# Patient Record
Sex: Female | Born: 2000 | Race: White | Hispanic: No | Marital: Single | Smoking: Current every day smoker
Health system: Southern US, Community
[De-identification: ages and names within clinical notes are randomized; demographics above are authoritative.]

## PROBLEM LIST (undated history)

## (undated) DIAGNOSIS — D649 Anemia, unspecified: Secondary | ICD-10-CM

## (undated) DIAGNOSIS — J45909 Unspecified asthma, uncomplicated: Secondary | ICD-10-CM

## (undated) HISTORY — PX: WISDOM TOOTH EXTRACTION: SHX21

## (undated) HISTORY — PX: HERNIA REPAIR: SHX51

---
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
## 2022-01-01 ENCOUNTER — Other Ambulatory Visit: Payer: Self-pay

## 2022-01-01 ENCOUNTER — Ambulatory Visit (INDEPENDENT_AMBULATORY_CARE_PROVIDER_SITE_OTHER): Payer: Medicaid Other | Admitting: Family Medicine

## 2022-01-01 VITALS — BP 107/61 | HR 85

## 2022-01-01 DIAGNOSIS — Z3403 Encounter for supervision of normal first pregnancy, third trimester: Secondary | ICD-10-CM

## 2022-01-01 DIAGNOSIS — O365990 Maternal care for other known or suspected poor fetal growth, unspecified trimester, not applicable or unspecified: Secondary | ICD-10-CM

## 2022-01-01 NOTE — Progress Notes (Signed)
   PRENATAL VISIT NOTE  Subjective:  Savannah Santos is a 21 y.o. G1P0000 at [redacted]w[redacted]d being seen today for ongoing prenatal care.  She is currently monitored for the following issues for this {Blank single:19197::"high-risk","low-risk"} pregnancy and has Supervision of low-risk first pregnancy; ADHD; Anemia; Anxiety; Depression; Dyslexia; Exercise-induced asthma; PTSD (post-traumatic stress disorder); Scoliosis; Rubella non-immune status, antepartum; Marginal insertion of umbilical cord affecting management of mother; and Fetal growth restriction antepartum on their problem list.  Patient reports {sx:14538}.  Contractions: Not present. Vag. Bleeding: None.  Movement: Present. Denies leaking of fluid.   The following portions of the patient's history were reviewed and updated as appropriate: allergies, current medications, past family history, past medical history, past social history, past surgical history and problem list.   Objective:   Vitals:   01/01/22 1401  BP: 107/61  Pulse: 85    Fetal Status: Fetal Heart Rate (bpm): 134   Movement: Present     General:  Alert, oriented and cooperative. Patient is in no acute distress.  Skin: Skin is warm and dry. No rash noted.   Cardiovascular: Normal heart rate noted  Respiratory: Normal respiratory effort, no problems with respiration noted  Abdomen: Soft, gravid, appropriate for gestational age.  Pain/Pressure: Absent     Pelvic: {Blank single:19197::"Cervical exam performed in the presence of a chaperone","Cervical exam deferred"}        Extremities: Normal range of motion.     Mental Status: Normal mood and affect. Normal behavior. Normal judgment and thought content.   Assessment and Plan:  Pregnancy: G1P0000 at [redacted]w[redacted]d 1. Encounter for supervision of low-risk first pregnancy in third trimester ***  2. Fetal growth restriction antepartum ***  {Blank single:19197::"Term","Preterm"} labor symptoms and general obstetric precautions  including but not limited to vaginal bleeding, contractions, leaking of fluid and fetal movement were reviewed in detail with the patient. Please refer to After Visit Summary for other counseling recommendations.   No follow-ups on file.  Future Appointments  Date Time Provider Department Center  01/08/2022  2:45 PM [facility] NURSE [facility]  01/08/2022  3:00 PM [facility] US1 [facility]  01/15/2022  1:35 PM Reva Bores, MD [facility]  01/22/2022  9:15 AM Reva Bores, MD [facility]  01/29/2022  8:35 AM Reva Bores, MD [facility]  02/05/2022  9:35 AM Federico Flake, MD [facility]  02/09/2022  9:15 AM [facility] NST [facility]  02/09/2022 11:15 AM Federico Flake, MD [facility]    Federico Flake, MD

## 2022-01-02 ENCOUNTER — Encounter: Payer: Self-pay | Admitting: Family Medicine

## 2022-01-08 ENCOUNTER — Ambulatory Visit: Payer: Medicaid Other | Attending: Obstetrics

## 2022-01-08 ENCOUNTER — Other Ambulatory Visit: Payer: Self-pay | Admitting: *Deleted

## 2022-01-08 ENCOUNTER — Ambulatory Visit: Payer: Medicaid Other | Admitting: *Deleted

## 2022-01-08 VITALS — BP 130/61 | HR 92

## 2022-01-08 DIAGNOSIS — Z3403 Encounter for supervision of normal first pregnancy, third trimester: Secondary | ICD-10-CM | POA: Diagnosis present

## 2022-01-08 DIAGNOSIS — O365990 Maternal care for other known or suspected poor fetal growth, unspecified trimester, not applicable or unspecified: Secondary | ICD-10-CM

## 2022-01-08 DIAGNOSIS — Z3A35 35 weeks gestation of pregnancy: Secondary | ICD-10-CM | POA: Diagnosis not present

## 2022-01-08 DIAGNOSIS — O365930 Maternal care for other known or suspected poor fetal growth, third trimester, not applicable or unspecified: Secondary | ICD-10-CM | POA: Diagnosis present

## 2022-01-08 DIAGNOSIS — O43193 Other malformation of placenta, third trimester: Secondary | ICD-10-CM | POA: Diagnosis not present

## 2022-01-15 ENCOUNTER — Ambulatory Visit (INDEPENDENT_AMBULATORY_CARE_PROVIDER_SITE_OTHER): Payer: Medicaid Other | Admitting: Family Medicine

## 2022-01-15 ENCOUNTER — Other Ambulatory Visit: Payer: Self-pay

## 2022-01-15 ENCOUNTER — Other Ambulatory Visit (HOSPITAL_COMMUNITY)
Admission: RE | Admit: 2022-01-15 | Discharge: 2022-01-15 | Disposition: A | Discharge status: Home / Self Care | Payer: Medicaid Other | Source: Ambulatory Visit | Attending: Family Medicine | Admitting: Family Medicine

## 2022-01-15 ENCOUNTER — Encounter: Payer: Self-pay | Admitting: Family Medicine

## 2022-01-15 VITALS — BP 114/76 | HR 105 | Wt 155.0 lb

## 2022-01-15 DIAGNOSIS — Z3403 Encounter for supervision of normal first pregnancy, third trimester: Secondary | ICD-10-CM

## 2022-01-15 DIAGNOSIS — Z2839 Other underimmunization status: Secondary | ICD-10-CM

## 2022-01-15 DIAGNOSIS — O09899 Supervision of other high risk pregnancies, unspecified trimester: Secondary | ICD-10-CM

## 2022-01-15 DIAGNOSIS — O365990 Maternal care for other known or suspected poor fetal growth, unspecified trimester, not applicable or unspecified: Secondary | ICD-10-CM

## 2022-01-15 DIAGNOSIS — O43199 Other malformation of placenta, unspecified trimester: Secondary | ICD-10-CM

## 2022-01-15 NOTE — Progress Notes (Signed)
   PRENATAL VISIT NOTE  Subjective:  Savannah Santos is a 21 y.o. G1P0000 at 32w4dbeing seen today for ongoing prenatal care.  She is currently monitored for the following issues for this low-risk pregnancy and has Supervision of low-risk first pregnancy; ADHD; Anemia; Anxiety; Depression; Dyslexia; Exercise-induced asthma; PTSD (post-traumatic stress disorder); Scoliosis; Rubella non-immune status, antepartum; Marginal insertion of umbilical cord affecting management of mother; and Fetal growth restriction antepartum on their problem list.  Patient reports no complaints.  Contractions: Irritability. Vag. Bleeding: None.  Movement: Present. Denies leaking of fluid.   The following portions of the patient's history were reviewed and updated as appropriate: allergies, current medications, past family history, past medical history, past social history, past surgical history and problem list.   Objective:   Vitals:   01/15/22 1341  BP: 114/76  Pulse: (!) 105  Weight: 155 lb (70.3 kg)    Fetal Status: Fetal Heart Rate (bpm): 150 Fundal Height: 35 cm Movement: Present  Presentation: Vertex  General:  Alert, oriented and cooperative. Patient is in no acute distress.  Skin: Skin is warm and dry. No rash noted.   Cardiovascular: Normal heart rate noted  Respiratory: Normal respiratory effort, no problems with respiration noted  Abdomen: Soft, gravid, appropriate for gestational age.  Pain/Pressure: Present     Pelvic: Cervical exam performed in the presence of a chaperone Dilation: 1.5 Effacement (%): 70 Station: -2  Extremities: Normal range of motion.     Mental Status: Normal mood and affect. Normal behavior. Normal judgment and thought content.   Assessment and Plan:  Pregnancy: G1P0000 at 362w4d. Fetal growth restriction antepartum Last at 13%--f/u in 3-4 weeks IOL @ 39-40 weeks if next growth ok  2. Marginal insertion of umbilical cord affecting management of mother Last  growth  3. Rubella non-immune status, antepartum MMR pp  4. Encounter for supervision of low-risk first pregnancy in third trimester Cultures today - GC/Chlamydia probe amp ([place], beta strep (group b only)  Preterm labor symptoms and general obstetric precautions including but not limited to vaginal bleeding, contractions, leaking of fluid and fetal movement were reviewed in detail with the patient. Please refer to After Visit Summary for other counseling recommendations.   Return in 1 week (on 01/22/2022).  Future Appointments  Date Time Provider DeEmery10/08/2021  9:15 AM PrDonnamae JudeMD [facility]10/03/2022  8:35 AM PrDonnamae JudeMD [facility]10/03/2022  3:30 PM [facility] NURSE [facility]10/03/2022  3:45 PM [facility] US5 [facility]10/19/2023  9:35 AM NeCaren MacadamMD WM[facility]10/23/2023  9:15 AM [facility] NST [facility]10/23/2023 11:15 AM NeCaren MacadamMD WMThe [facility]  TaDonnamae JudeMD

## 2022-01-16 LAB — GC/CHLAMYDIA PROBE AMP (~~LOC~~) NOT AT ARMC
Chlamydia: NEGATIVE
Comment: NEGATIVE
Comment: NORMAL
Neisseria Gonorrhea: NEGATIVE

## 2022-01-18 LAB — CULTURE, BETA STREP (GROUP B ONLY): Strep Gp B Culture: POSITIVE — AB

## 2022-01-19 ENCOUNTER — Encounter: Payer: Self-pay | Admitting: Family Medicine

## 2022-01-19 DIAGNOSIS — O99820 Streptococcus B carrier state complicating pregnancy: Secondary | ICD-10-CM | POA: Insufficient documentation

## 2022-01-22 ENCOUNTER — Ambulatory Visit (INDEPENDENT_AMBULATORY_CARE_PROVIDER_SITE_OTHER): Payer: Medicaid Other | Admitting: Family Medicine

## 2022-01-22 ENCOUNTER — Other Ambulatory Visit: Payer: Self-pay

## 2022-01-22 VITALS — BP 127/78 | HR 94 | Wt 157.8 lb

## 2022-01-22 DIAGNOSIS — O99820 Streptococcus B carrier state complicating pregnancy: Secondary | ICD-10-CM

## 2022-01-22 DIAGNOSIS — Z3403 Encounter for supervision of normal first pregnancy, third trimester: Secondary | ICD-10-CM

## 2022-01-22 DIAGNOSIS — O09899 Supervision of other high risk pregnancies, unspecified trimester: Secondary | ICD-10-CM

## 2022-01-22 DIAGNOSIS — Z2839 Other underimmunization status: Secondary | ICD-10-CM

## 2022-01-22 DIAGNOSIS — O365990 Maternal care for other known or suspected poor fetal growth, unspecified trimester, not applicable or unspecified: Secondary | ICD-10-CM

## 2022-01-22 NOTE — Progress Notes (Signed)
   PRENATAL VISIT NOTE  Subjective:  Zahraa Stejskal is a 21 y.o. G1P0000 at [redacted]w[redacted]d being seen today for ongoing prenatal care.  She is currently monitored for the following issues for this low-risk pregnancy and has Supervision of low-risk first pregnancy; ADHD; Anemia; Anxiety; Depression; Dyslexia; Exercise-induced asthma; PTSD (post-traumatic stress disorder); Scoliosis; Rubella non-immune status, antepartum; Marginal insertion of umbilical cord affecting management of mother; Fetal growth restriction antepartum; and Group B Streptococcus carrier, +RV culture, currently pregnant on their problem list.  Patient reports no complaints.  Contractions: Irregular. Vag. Bleeding: None.  Movement: Present. Denies leaking of fluid.   The following portions of the patient's history were reviewed and updated as appropriate: allergies, current medications, past family history, past medical history, past social history, past surgical history and problem list.   Objective:   Vitals:   01/22/22 0914  BP: 127/78  Pulse: 94  Weight: 157 lb 12.8 oz (71.6 kg)    Fetal Status: Fetal Heart Rate (bpm): 145 Fundal Height: 36 cm Movement: Present  Presentation: Vertex  General:  Alert, oriented and cooperative. Patient is in no acute distress.  Skin: Skin is warm and dry. No rash noted.   Cardiovascular: Normal heart rate noted  Respiratory: Normal respiratory effort, no problems with respiration noted  Abdomen: Soft, gravid, appropriate for gestational age.  Pain/Pressure: Present     Pelvic: Cervical exam performed in the presence of a chaperone Dilation: 1.5 Effacement (%): 70 Station: -2  Extremities: Normal range of motion.     Mental Status: Normal mood and affect. Normal behavior. Normal judgment and thought content.   Assessment and Plan:  Pregnancy: G1P0000 at [redacted]w[redacted]d 1. Fetal growth restriction antepartum Last u/s at 13%, f/u next week Delivery timing based on u/s  2. Encounter for supervision  of low-risk first pregnancy in third trimester Continue routine prenatal care.  3. Group B Streptococcus carrier, +RV culture, currently pregnant Will need treatment in labor  4. Rubella non-immune status, antepartum MMR pp  Term labor symptoms and general obstetric precautions including but not limited to vaginal bleeding, contractions, leaking of fluid and fetal movement were reviewed in detail with the patient. Please refer to After Visit Summary for other counseling recommendations.   Return in 1 week (on 01/29/2022).  Future Appointments  Date Time Provider Department Center  01/29/2022  2:35 PM Newton, Kimberly Niles, MD [facility]  01/29/2022  3:30 PM [facility] NURSE [facility]  01/29/2022  3:45 PM [facility] US5 [facility]  02/05/2022  9:35 AM Newton, Kimberly Niles, MD [facility]  02/09/2022  9:15 AM [facility] NST [facility]  02/09/2022 11:15 AM Newton, Kimberly Niles, MD [facility]    Tanya S Pratt, MD  

## 2022-01-26 ENCOUNTER — Encounter: Payer: Self-pay | Admitting: Family Medicine

## 2022-01-26 DIAGNOSIS — L299 Pruritus, unspecified: Secondary | ICD-10-CM

## 2022-01-27 ENCOUNTER — Other Ambulatory Visit: Payer: Self-pay

## 2022-01-27 ENCOUNTER — Ambulatory Visit: Payer: Medicaid Other

## 2022-01-27 DIAGNOSIS — L299 Pruritus, unspecified: Secondary | ICD-10-CM

## 2022-01-29 ENCOUNTER — Encounter: Payer: Medicaid Other | Admitting: Family Medicine

## 2022-01-29 ENCOUNTER — Ambulatory Visit: Payer: Medicaid Other | Admitting: *Deleted

## 2022-01-29 ENCOUNTER — Other Ambulatory Visit: Payer: Self-pay

## 2022-01-29 ENCOUNTER — Ambulatory Visit: Payer: Medicaid Other | Attending: Maternal & Fetal Medicine

## 2022-01-29 ENCOUNTER — Ambulatory Visit (INDEPENDENT_AMBULATORY_CARE_PROVIDER_SITE_OTHER): Payer: Medicaid Other | Admitting: Family Medicine

## 2022-01-29 VITALS — BP 121/64 | HR 87

## 2022-01-29 VITALS — BP 130/71 | HR 120 | Wt 157.6 lb

## 2022-01-29 DIAGNOSIS — O99820 Streptococcus B carrier state complicating pregnancy: Secondary | ICD-10-CM

## 2022-01-29 DIAGNOSIS — O365990 Maternal care for other known or suspected poor fetal growth, unspecified trimester, not applicable or unspecified: Secondary | ICD-10-CM | POA: Insufficient documentation

## 2022-01-29 DIAGNOSIS — O365930 Maternal care for other known or suspected poor fetal growth, third trimester, not applicable or unspecified: Secondary | ICD-10-CM | POA: Diagnosis not present

## 2022-01-29 DIAGNOSIS — Z3403 Encounter for supervision of normal first pregnancy, third trimester: Secondary | ICD-10-CM

## 2022-01-29 DIAGNOSIS — O43193 Other malformation of placenta, third trimester: Secondary | ICD-10-CM | POA: Diagnosis not present

## 2022-01-29 DIAGNOSIS — Z3A38 38 weeks gestation of pregnancy: Secondary | ICD-10-CM

## 2022-01-29 DIAGNOSIS — O26843 Uterine size-date discrepancy, third trimester: Secondary | ICD-10-CM | POA: Diagnosis not present

## 2022-01-29 LAB — BILE ACIDS, TOTAL: Bile Acids Total: 3.4 umol/L (ref 0.0–10.0)

## 2022-01-30 ENCOUNTER — Encounter (HOSPITAL_COMMUNITY): Payer: Self-pay | Admitting: *Deleted

## 2022-01-30 ENCOUNTER — Telehealth (HOSPITAL_COMMUNITY): Payer: Self-pay | Admitting: *Deleted

## 2022-01-30 NOTE — Telephone Encounter (Signed)
Preadmission screen  

## 2022-02-01 ENCOUNTER — Other Ambulatory Visit: Payer: Self-pay | Admitting: Advanced Practice Midwife

## 2022-02-01 DIAGNOSIS — Z87898 Personal history of other specified conditions: Secondary | ICD-10-CM

## 2022-02-02 ENCOUNTER — Other Ambulatory Visit: Payer: Self-pay

## 2022-02-03 ENCOUNTER — Inpatient Hospital Stay (HOSPITAL_COMMUNITY): Payer: Medicaid Other | Admitting: Anesthesiology

## 2022-02-03 ENCOUNTER — Inpatient Hospital Stay (HOSPITAL_COMMUNITY): Payer: Medicaid Other

## 2022-02-03 ENCOUNTER — Encounter (HOSPITAL_COMMUNITY): Payer: Self-pay | Admitting: Obstetrics and Gynecology

## 2022-02-03 ENCOUNTER — Inpatient Hospital Stay (HOSPITAL_COMMUNITY)
Admission: AD | Admit: 2022-02-03 | Discharge: 2022-02-05 | DRG: 807 | Disposition: A | Discharge status: Home / Self Care | Payer: Medicaid Other | Attending: Family Medicine | Admitting: Family Medicine

## 2022-02-03 DIAGNOSIS — Z3A39 39 weeks gestation of pregnancy: Secondary | ICD-10-CM

## 2022-02-03 DIAGNOSIS — F32A Depression, unspecified: Secondary | ICD-10-CM | POA: Diagnosis present

## 2022-02-03 DIAGNOSIS — F17290 Nicotine dependence, other tobacco product, uncomplicated: Secondary | ICD-10-CM | POA: Diagnosis present

## 2022-02-03 DIAGNOSIS — Z23 Encounter for immunization: Secondary | ICD-10-CM

## 2022-02-03 DIAGNOSIS — F419 Anxiety disorder, unspecified: Secondary | ICD-10-CM | POA: Diagnosis present

## 2022-02-03 DIAGNOSIS — Z87898 Personal history of other specified conditions: Secondary | ICD-10-CM

## 2022-02-03 DIAGNOSIS — O99334 Smoking (tobacco) complicating childbirth: Secondary | ICD-10-CM | POA: Diagnosis present

## 2022-02-03 DIAGNOSIS — O9902 Anemia complicating childbirth: Secondary | ICD-10-CM | POA: Diagnosis present

## 2022-02-03 DIAGNOSIS — O99344 Other mental disorders complicating childbirth: Secondary | ICD-10-CM | POA: Diagnosis present

## 2022-02-03 DIAGNOSIS — O09899 Supervision of other high risk pregnancies, unspecified trimester: Secondary | ICD-10-CM

## 2022-02-03 DIAGNOSIS — Z2839 Other underimmunization status: Secondary | ICD-10-CM

## 2022-02-03 DIAGNOSIS — O99820 Streptococcus B carrier state complicating pregnancy: Secondary | ICD-10-CM

## 2022-02-03 DIAGNOSIS — O365931 Maternal care for other known or suspected poor fetal growth, third trimester, fetus 1: Secondary | ICD-10-CM | POA: Diagnosis not present

## 2022-02-03 DIAGNOSIS — Z3403 Encounter for supervision of normal first pregnancy, third trimester: Principal | ICD-10-CM

## 2022-02-03 DIAGNOSIS — O43123 Velamentous insertion of umbilical cord, third trimester: Secondary | ICD-10-CM | POA: Diagnosis present

## 2022-02-03 DIAGNOSIS — D649 Anemia, unspecified: Secondary | ICD-10-CM | POA: Diagnosis present

## 2022-02-03 DIAGNOSIS — Z3400 Encounter for supervision of normal first pregnancy, unspecified trimester: Secondary | ICD-10-CM

## 2022-02-03 DIAGNOSIS — O99824 Streptococcus B carrier state complicating childbirth: Secondary | ICD-10-CM | POA: Diagnosis present

## 2022-02-03 DIAGNOSIS — O43199 Other malformation of placenta, unspecified trimester: Secondary | ICD-10-CM | POA: Diagnosis present

## 2022-02-03 DIAGNOSIS — F4310 Post-traumatic stress disorder, unspecified: Secondary | ICD-10-CM | POA: Diagnosis present

## 2022-02-03 DIAGNOSIS — O26893 Other specified pregnancy related conditions, third trimester: Secondary | ICD-10-CM | POA: Diagnosis present

## 2022-02-03 DIAGNOSIS — O365990 Maternal care for other known or suspected poor fetal growth, unspecified trimester, not applicable or unspecified: Secondary | ICD-10-CM | POA: Diagnosis present

## 2022-02-03 HISTORY — DX: Personal history of other specified conditions: Z87.898

## 2022-02-03 LAB — CBC
HCT: 30.7 % — ABNORMAL LOW (ref 36.0–46.0)
Hemoglobin: 10.1 g/dL — ABNORMAL LOW (ref 12.0–15.0)
MCH: 31.8 pg (ref 26.0–34.0)
MCHC: 32.9 g/dL (ref 30.0–36.0)
MCV: 96.5 fL (ref 80.0–100.0)
Platelets: 238 10*3/uL (ref 150–400)
RBC: 3.18 MIL/uL — ABNORMAL LOW (ref 3.87–5.11)
RDW: 13 % (ref 11.5–15.5)
WBC: 16 10*3/uL — ABNORMAL HIGH (ref 4.0–10.5)
nRBC: 0 % (ref 0.0–0.2)

## 2022-02-03 LAB — RPR: RPR Ser Ql: NONREACTIVE

## 2022-02-03 LAB — TYPE AND SCREEN
ABO/RH(D): A POS
Antibody Screen: NEGATIVE

## 2022-02-03 MED ORDER — OXYTOCIN 10 UNIT/ML IJ SOLN
10.0000 [IU] | Freq: Once | INTRAMUSCULAR | Status: AC
  Administered 2022-02-03: 10 [IU]

## 2022-02-03 MED ORDER — PENICILLIN G POT IN DEXTROSE 60000 UNIT/ML IV SOLN
3.0000 10*6.[IU] | INTRAVENOUS | Status: DC
  Administered 2022-02-03 (×3): 3 10*6.[IU] via INTRAVENOUS
  Filled 2022-02-03 (×3): qty 50

## 2022-02-03 MED ORDER — WITCH HAZEL-GLYCERIN EX PADS: 1.0000 | MEDICATED_PAD | CUTANEOUS | Status: DC | PRN

## 2022-02-03 MED ORDER — PHENYLEPHRINE 80 MCG/ML (10ML) SYRINGE FOR IV PUSH (FOR BLOOD PRESSURE SUPPORT): 80.0000 ug | PREFILLED_SYRINGE | INTRAVENOUS | Status: DC | PRN

## 2022-02-03 MED ORDER — OXYTOCIN-SODIUM CHLORIDE 30-0.9 UT/500ML-% IV SOLN
2.5000 [IU]/h | INTRAVENOUS | Status: DC
  Filled 2022-02-03: qty 500

## 2022-02-03 MED ORDER — BENZOCAINE-MENTHOL 20-0.5 % EX AERO
1.0000 | INHALATION_SPRAY | CUTANEOUS | Status: DC | PRN
  Administered 2022-02-04: 1 via TOPICAL
  Filled 2022-02-03 (×3): qty 56

## 2022-02-03 MED ORDER — DIPHENHYDRAMINE HCL 25 MG PO CAPS: 25.0000 mg | ORAL_CAPSULE | Freq: Four times a day (QID) | ORAL | Status: DC | PRN

## 2022-02-03 MED ORDER — IBUPROFEN 600 MG PO TABS
600.0000 mg | ORAL_TABLET | Freq: Four times a day (QID) | ORAL | Status: DC
  Administered 2022-02-03 – 2022-02-05 (×7): 600 mg via ORAL
  Filled 2022-02-03 (×7): qty 1

## 2022-02-03 MED ORDER — MISOPROSTOL 50MCG HALF TABLET
50.0000 ug | ORAL_TABLET | Freq: Once | ORAL | Status: AC
  Administered 2022-02-03: 50 ug via ORAL
  Filled 2022-02-03: qty 1

## 2022-02-03 MED ORDER — LACTATED RINGERS IV SOLN: 500.0000 mL | INTRAVENOUS | Status: DC | PRN

## 2022-02-03 MED ORDER — TRANEXAMIC ACID-NACL 1000-0.7 MG/100ML-% IV SOLN
INTRAVENOUS | Status: AC
  Filled 2022-02-03: qty 100

## 2022-02-03 MED ORDER — ACETAMINOPHEN 325 MG PO TABS: 650.0000 mg | ORAL_TABLET | ORAL | Status: DC | PRN

## 2022-02-03 MED ORDER — EPHEDRINE 5 MG/ML INJ: 10.0000 mg | INTRAVENOUS | Status: DC | PRN

## 2022-02-03 MED ORDER — LIDOCAINE-EPINEPHRINE (PF) 1.5 %-1:200000 IJ SOLN
INTRAMUSCULAR | Status: DC | PRN
  Administered 2022-02-03: 3 mL via EPIDURAL
  Administered 2022-02-03: 2 mL via EPIDURAL

## 2022-02-03 MED ORDER — PRENATAL MULTIVITAMIN CH
1.0000 | ORAL_TABLET | Freq: Every day | ORAL | Status: DC
  Administered 2022-02-04 – 2022-02-05 (×2): 1 via ORAL
  Filled 2022-02-03 (×2): qty 1

## 2022-02-03 MED ORDER — FENTANYL CITRATE (PF) 100 MCG/2ML IJ SOLN
100.0000 ug | INTRAMUSCULAR | Status: DC | PRN
  Administered 2022-02-03: 100 ug via INTRAVENOUS
  Filled 2022-02-03: qty 2

## 2022-02-03 MED ORDER — SENNOSIDES-DOCUSATE SODIUM 8.6-50 MG PO TABS
2.0000 | ORAL_TABLET | Freq: Every day | ORAL | Status: DC
  Administered 2022-02-04 – 2022-02-05 (×2): 2 via ORAL
  Filled 2022-02-03 (×2): qty 2

## 2022-02-03 MED ORDER — TERBUTALINE SULFATE 1 MG/ML IJ SOLN: 0.2500 mg | Freq: Once | INTRAMUSCULAR | Status: DC | PRN

## 2022-02-03 MED ORDER — DIPHENHYDRAMINE HCL 50 MG/ML IJ SOLN: 12.5000 mg | INTRAMUSCULAR | Status: DC | PRN

## 2022-02-03 MED ORDER — LACTATED RINGERS IV SOLN: INTRAVENOUS | Status: DC

## 2022-02-03 MED ORDER — TRANEXAMIC ACID-NACL 1000-0.7 MG/100ML-% IV SOLN
1000.0000 mg | INTRAVENOUS | Status: AC
  Administered 2022-02-03: 1000 mg via INTRAVENOUS

## 2022-02-03 MED ORDER — FENTANYL-BUPIVACAINE-NACL 0.5-0.125-0.9 MG/250ML-% EP SOLN
12.0000 mL/h | EPIDURAL | Status: DC | PRN
  Administered 2022-02-03: 12 mL/h via EPIDURAL
  Filled 2022-02-03: qty 250

## 2022-02-03 MED ORDER — SIMETHICONE 80 MG PO CHEW: 80.0000 mg | CHEWABLE_TABLET | ORAL | Status: DC | PRN

## 2022-02-03 MED ORDER — COCONUT OIL OIL: 1.0000 | TOPICAL_OIL | Status: DC | PRN

## 2022-02-03 MED ORDER — SOD CITRATE-CITRIC ACID 500-334 MG/5ML PO SOLN: 30.0000 mL | ORAL | Status: DC | PRN

## 2022-02-03 MED ORDER — ONDANSETRON HCL 4 MG PO TABS: 4.0000 mg | ORAL_TABLET | ORAL | Status: DC | PRN

## 2022-02-03 MED ORDER — OXYTOCIN 10 UNIT/ML IJ SOLN
INTRAMUSCULAR | Status: AC
  Filled 2022-02-03: qty 1

## 2022-02-03 MED ORDER — MISOPROSTOL 25 MCG QUARTER TABLET
25.0000 ug | ORAL_TABLET | Freq: Once | ORAL | Status: DC
  Filled 2022-02-03: qty 1

## 2022-02-03 MED ORDER — ONDANSETRON HCL 4 MG/2ML IJ SOLN: 4.0000 mg | Freq: Four times a day (QID) | INTRAMUSCULAR | Status: DC | PRN

## 2022-02-03 MED ORDER — OXYTOCIN BOLUS FROM INFUSION
333.0000 mL | Freq: Once | INTRAVENOUS | Status: AC
  Administered 2022-02-03: 333 mL via INTRAVENOUS

## 2022-02-03 MED ORDER — LIDOCAINE HCL (PF) 1 % IJ SOLN: 30.0000 mL | INTRAMUSCULAR | Status: DC | PRN

## 2022-02-03 MED ORDER — DIBUCAINE (PERIANAL) 1 % EX OINT: 1.0000 | TOPICAL_OINTMENT | CUTANEOUS | Status: DC | PRN

## 2022-02-03 MED ORDER — LACTATED RINGERS IV SOLN: 500.0000 mL | Freq: Once | INTRAVENOUS | Status: DC

## 2022-02-03 MED ORDER — OXYCODONE-ACETAMINOPHEN 5-325 MG PO TABS: 1.0000 | ORAL_TABLET | ORAL | Status: DC | PRN

## 2022-02-03 MED ORDER — SODIUM CHLORIDE 0.9 % IV SOLN
5.0000 10*6.[IU] | Freq: Once | INTRAVENOUS | Status: AC
  Administered 2022-02-03: 5 10*6.[IU] via INTRAVENOUS
  Filled 2022-02-03: qty 5

## 2022-02-03 MED ORDER — ONDANSETRON HCL 4 MG/2ML IJ SOLN: 4.0000 mg | INTRAMUSCULAR | Status: DC | PRN

## 2022-02-03 MED ORDER — CEFAZOLIN SODIUM-DEXTROSE 2-4 GM/100ML-% IV SOLN
2.0000 g | Freq: Once | INTRAVENOUS | Status: AC
  Administered 2022-02-03: 2 g via INTRAVENOUS
  Filled 2022-02-03: qty 100

## 2022-02-03 MED ORDER — OXYCODONE-ACETAMINOPHEN 5-325 MG PO TABS: 2.0000 | ORAL_TABLET | ORAL | Status: DC | PRN

## 2022-02-03 NOTE — Lactation Note (Signed)
This note was copied from a baby's chart. Lactation Consultation Note  Patient Name: Savannah Santos ZOXWR'U Date: 02/03/2022 Reason for consult: L&D Initial assessment;Term;Primapara;1st time breastfeeding Age:19 hours   Initial L&D Consult:  Attempted to visit with family < 1 hour after birth, however, staff members were attending to birth parent.  Returned > 1 hour after birth when RN called and requested assistance.  Baby was awake and latched, however, she began to cry when at the breast.  She did not have an interest in initiating a suck at this time.  Repeated a couple of times without success.  Reassured parents that lactation services will be available on the M/B unit.  Placed baby STS and she fell asleep.  Support person and one other visitor present.   Maternal Data    Feeding Mother's Current Feeding Choice: Breast Milk  LATCH Score Latch: Too sleepy or reluctant, no latch achieved, no sucking elicited.  Audible Swallowing: None  Type of Nipple: Everted at rest and after stimulation  Comfort (Breast/Nipple): Soft / non-tender  Hold (Positioning): Assistance needed to correctly position infant at breast and maintain latch.  LATCH Score: 5   Lactation Tools Discussed/Used    Interventions Interventions: Assisted with latch;Skin to skin  Discharge    Consult Status Consult Status: Follow-up from L&D    Mary Secord R Danasia Baker 02/03/2022, 6:11 PM

## 2022-02-03 NOTE — Discharge Summary (Signed)
Postpartum Discharge Summary  Date of Service updated***     Patient Name: Savannah Santos DOB: [birth year] MRN: [id number]  Date of admission: 02/03/2022 Delivery date:02/03/2022  Delivering provider: Gaylan Gerold R  Date of discharge: 02/03/2022  Admitting diagnosis: History of poor fetal growth [Z87.898] Intrauterine pregnancy: [redacted]w[redacted]d    Secondary diagnosis:  Principal Problem:   History of poor fetal growth Active Problems:   Supervision of low-risk first pregnancy   Anemia   Anxiety   Depression   PTSD (post-traumatic stress disorder)   Rubella non-immune status, antepartum   Marginal insertion of umbilical cord affecting management of mother   Fetal growth restriction antepartum   Group B Streptococcus carrier, +RV culture, currently pregnant  Additional problems: Retained placenta with manual extraction    Discharge diagnosis: Term Pregnancy Delivered                                              Post partum procedures:{Postpartum procedures:23558} Augmentation: Cytotec and IP Foley Complications: None  Hospital course: Induction of Labor With Vaginal Delivery   21y.o. yo G1P1001 at 325w2das admitted to the hospital 02/03/2022 for induction of labor.  Indication for induction: Elective.  Patient had an uncomplicated labor course Membrane Rupture Time/Date: 9:38 AM ,02/03/2022   Delivery Method:Vaginal, Spontaneous  Episiotomy: None  Lacerations:  Vaginal;1st degree  Details of delivery can be found in separate delivery note.  Patient had a postpartum course complicated by manual extraction of placenta. Patient is discharged home 02/03/22.  Newborn Data: Birth date:02/03/2022  Birth time:4:47 PM  Gender:Female  Living status:Living  Apgars:9 ,9  Weight:6 lb 8.8 oz (2.97 kg)   Magnesium Sulfate received: No BMZ received: No Rhophylac:N/A MMR:N/A T-DaP:Given prenatally Flu: No Transfusion:No  Physical exam  Vitals:   02/03/22 1815 02/03/22 1844  02/03/22 1948 02/03/22 2051  BP: 98/62 121/83 124/66 115/73  Pulse: 93 100 86 82  Resp: '18 18 16 17  ' Temp: (!) 97.5 F (36.4 C)  98.9 F (37.2 C) 98.6 F (37 C)  TempSrc: Oral  Oral Oral  SpO2:   100% 100%  Weight:      Height:       General: {Exam; general:21111117} Lochia: {Desc; appropriate/inappropriate:30686::"appropriate"} Uterine Fundus: {Desc; firm/soft:30687} Incision: {Exam; incision:21111123} DVT Evaluation: {Exam; dvOMV:6720947}abs: Lab Results  Component Value Date   WBC 16.0 (H) 02/03/2022   HGB 10.1 (L) 02/03/2022   HCT 30.7 (L) 02/03/2022   MCV 96.5 02/03/2022   PLT 238 02/03/2022      Latest Ref Rng & Units 06/11/2021   10:07 PM  CMP  Glucose 70 - 99 mg/dL 103   BUN 6 - 20 mg/dL <5   Creatinine 0.44 - 1.00 mg/dL 0.56   Sodium 135 - 145 mmol/L 138   Potassium 3.5 - 5.1 mmol/L 3.5   Chloride 98 - 111 mmol/L 107   CO2 22 - 32 mmol/L 26   Calcium 8.9 - 10.3 mg/dL 9.4   Total Protein 6.5 - 8.1 g/dL 6.7   Total Bilirubin 0.3 - 1.2 mg/dL <0.1   Alkaline Phos 38 - 126 U/L 83   AST 15 - 41 U/L 17   ALT 0 - 44 U/L 15    Edinburgh Score:     No data to display           After visit  meds:  Allergies as of 02/03/2022       Reactions   Bee Venom    Latex      Med Rec must be completed prior to using this [facility]***      Discharge home in stable condition Infant Feeding: {Baby feeding:23562} Infant Disposition:{CHL IP OB HOME WITH GKMKTL:73081} Discharge instruction: per After Visit Summary and Postpartum booklet. Activity: Advance as tolerated. Pelvic rest for 6 weeks.  Diet: {OB QEHA:70658260} Future Appointments:No future appointments.   Follow up Visit: Message sent to [facility] on 02/03/22  Please schedule this patient for a In person postpartum visit in 4 weeks with the following provider: Any provider. Additional Postpartum F/U: None   Low risk pregnancy complicated by:  resolved FGR, MCI Delivery mode:  Vaginal, Spontaneous   Anticipated Birth Control:  POPs   02/03/2022 Gabriel Carina, CNM

## 2022-02-03 NOTE — Anesthesia Procedure Notes (Addendum)
Epidural Patient location during procedure: OB Start time: 02/03/2022 9:56 AM End time: 02/03/2022 10:13 AM  Staffing Anesthesiologist: Nolon Nations, MD Performed: anesthesiologist   Preanesthetic Checklist Completed: patient identified, IV checked, risks and benefits discussed, monitors and equipment checked, pre-op evaluation and timeout performed  Epidural Patient position: sitting Prep: DuraPrep and site prepped and draped Patient monitoring: heart rate, continuous pulse ox and blood pressure Approach: midline Location: L2-L3 Injection technique: LOR air and LOR saline  Needle:  Needle type: Tuohy  Needle gauge: 17 G Needle length: 9 cm Needle insertion depth: 6 cm Catheter type: closed end flexible Catheter size: 19 Gauge Catheter at skin depth: 12 cm Test dose: negative  Assessment Sensory level: T8 Events: blood not aspirated, injection not painful, no injection resistance, no paresthesia and negative IV test  Additional Notes Reason for block:procedure for pain

## 2022-02-03 NOTE — Progress Notes (Addendum)
Patient ID: Savannah Santos, female   DOB: 16-Feb-2001, 21 y.o.   MRN: [id number]  Called to patient's room after delivery for retained placenta after 20 minutes. Manual extraction of placenta performed after patient consent. Several passes done to ensure all of placenta and membranes removed. Uterus firm with minimal bleeding.  Will give Ancef 2g.  Truett Mainland, DO 02/03/2022 5:25 PM

## 2022-02-03 NOTE — Progress Notes (Signed)
Labor Progress Note Savannah Santos is a 21 y.o. G1P0000 at [redacted]w[redacted]d presented for eIOL  S: Pt doing well, FB out  O:  BP 130/71   Pulse 81   Ht 5\' 7"  (1.702 m)   Wt 71.7 kg   LMP 05/04/2021 (Approximate)   BMI 24.75 kg/m  EFM 155bpm/Moderate variability/ 15x15 accels/ None decels  CVE: Dilation: 5 Effacement (%): 70 Cervical Position: Posterior Station: -2 Presentation: Vertex Exam by:: Dr. Clement Husbands   A&P: 22 y.o. G1P0000 [redacted]w[redacted]d here for IOL as above  #Labor: Progressing well. Appropriate contraction pattern, so will not add medication for augmentation at this time. Fetus somewhat ballotable- plan to AROM at next check #Pain: Support, IV pain meds #FWB: CAT 1 #GBS positive- PCN   Ernestine Conrad Mercado-Ortiz, DO 6:32 AM

## 2022-02-03 NOTE — Anesthesia Preprocedure Evaluation (Signed)
Anesthesia Evaluation  Patient identified by MRN, date of birth, ID band Patient awake    Reviewed: Allergy & Precautions, Patient's Chart, lab work & pertinent test results  Airway Mallampati: II  TM Distance: >3 FB Neck ROM: Full    Dental  (+) Dental Advisory Given   Pulmonary asthma , Current Smoker,    Pulmonary exam normal breath sounds clear to auscultation       Cardiovascular negative cardio ROS Normal cardiovascular exam Rhythm:Regular Rate:Normal     Neuro/Psych PSYCHIATRIC DISORDERS Anxiety Depression negative neurological ROS     GI/Hepatic negative GI ROS, Neg liver ROS,   Endo/Other  negative endocrine ROS  Renal/GU negative Renal ROS     Musculoskeletal negative musculoskeletal ROS (+)   Abdominal   Peds  Hematology  (+) Blood dyscrasia, anemia ,   Anesthesia Other Findings   Reproductive/Obstetrics (+) Pregnancy                             Anesthesia Physical Anesthesia Plan  ASA: 2  Anesthesia Plan: Epidural   Post-op Pain Management:    Induction:   PONV Risk Score and Plan:   Airway Management Planned:   Additional Equipment:   Intra-op Plan:   Post-operative Plan:   Informed Consent: I have reviewed the patients History and Physical, chart, labs and discussed the procedure including the risks, benefits and alternatives for the proposed anesthesia with the patient or authorized representative who has indicated his/her understanding and acceptance.       Plan Discussed with:   Anesthesia Plan Comments:         Anesthesia Quick Evaluation

## 2022-02-03 NOTE — Progress Notes (Signed)
Patient ID: Savannah Santos, female   DOB: Feb 07, 2001, 21 y.o.   MRN: [id number]  Labor Progress Note Savannah Santos is a 21 y.o. G1P0000 at [redacted]w[redacted]d presented for eIOL for resolved FGR and MCI  S:  Pt ambulating in room, contractions starting to get stronger and more regular. Sister and FOB at bedside for support  O:  BP (!) 105/59   Pulse 79   Temp 97.7 F (36.5 C) (Oral)   Resp 18   Ht 5\' 7"  (1.702 m)   Wt 158 lb (71.7 kg)   LMP 05/04/2021 (Approximate)   BMI 24.75 kg/m  EFM: baseline 145 bpm/ moderate variability/ 15x15 accels/ no decels  Toco/IUPC: q1-47min SVE: Dilation: 5 Effacement (%): 90 Cervical Position: Posterior Station: -2 Presentation: Vertex Exam by:: [place], rn Pitocin: None   A/P: 21 y.o. G1P0000 [redacted]w[redacted]d  1. Labor: Active labor 2. FWB: Cat 1 3. Pain: Planning epidural, encouraged her to get it whenever ready  Will assess for AROM once epidural placed. Anticipate SVD.  Gaylan Gerold, CNM, MSN, [place] Certified Nurse Midwife, [facility]

## 2022-02-03 NOTE — Lactation Note (Signed)
This note was copied from a baby's chart. Lactation Consultation Note  Patient Name: Savannah Santos IBBCW'U Date: 02/03/2022   Age:19 hours Attempted to see mom 2 times. Once mom was in BR, now mom is sleeping. Will ask RN to call when mom is awake.  Maternal Data    Feeding    LATCH Score                    Lactation Tools Discussed/Used    Interventions    Discharge    Consult Status      Theodoro Kalata 02/03/2022, 11:28 PM

## 2022-02-03 NOTE — H&P (Signed)
OBSTETRIC ADMISSION HISTORY AND PHYSICAL  Savannah Santos is a 21 y.o. female G1P0000 with IUP at [redacted]w[redacted]d by LMP presenting for IOL. She reports +FMs, No LOF, no VB, no blurry vision, headaches or peripheral edema, and RUQ pain.  She plans on breast feeding. She request POPs for birth control. She received her prenatal care at  [facility]    Dating: By LMP --->  Estimated Date of Delivery: 02/08/22  Sono:   @[redacted]w[redacted]d , CWD, normal anatomy, cephalic presentation, 5009F, 26% EFW   Prenatal History/Complications:   Patient Active Problem List   Diagnosis Date Noted   History of poor fetal growth 02/03/2022   Group B Streptococcus carrier, +RV culture, currently pregnant 01/19/2022   Fetal growth restriction antepartum 12/01/2021   Marginal insertion of umbilical cord affecting management of mother 11/04/2021   Rubella non-immune status, antepartum 08/06/2021   Supervision of low-risk first pregnancy 07/08/2021   PTSD (post-traumatic stress disorder) 04/25/2021   ADHD 03/31/2021   Anemia 03/31/2021   Anxiety 03/31/2021   Depression 03/31/2021   Dyslexia 03/31/2021   Exercise-induced asthma 03/31/2021   Scoliosis 03/31/2021    Nursing Staff Provider  Office Location [facility] Dating  LMP consistent with 10 week [place]  [place] [ ]  Traditional [ ]  Centering [x]  Mom-Baby Dyad    Language  English Anatomy US    Flu Vaccine  01/03/21 Genetic/Carrier Screen  NIPS:   Low risk AFP:   normal Horizon: neg 4/4  TDaP Vaccine   11/04/2021 given Hgb A1C or  GTT Early - normal Third trimester - normal  COVID Vaccine Pfizer 11/21,12/21,11/22   LAB RESULTS   Rhogam  NA Blood Type A/Positive/-- (04/18 1134)   Baby Feeding Plan Breast Antibody Negative (04/18 1134)  Contraception OCPs Rubella <0.90 (04/18 1134)  Circumcision Yes RPR Non Reactive (04/18 1134)   Pediatrician  Mom +Baby Dyad  HBsAg Negative (04/18 1134)   Support Person Charles (partner/FOB) HCVAb neg  Prenatal Classes  HIV Non Reactive (04/18  1134)     BTL Consent NA GBS  Positive  VBAC Consent NA Pap NA       DME Rx [ ]  BP cuff [ ]  Weight Scale Waterbirth  [ ]  Class [ ]  Consent [ ]  CNM visit  PHQ9 & GAD7 [ x ] new OB [ x ] 28 weeks  [  ] 36 weeks Induction  [ ]  Orders Entered [ ] Foley Y/N    Past Medical History: Past Medical History:  Diagnosis Date   Anemia    Asthma     Past Surgical History: Past Surgical History:  Procedure Laterality Date   HERNIA REPAIR  2013   WISDOM TOOTH EXTRACTION Bilateral     Obstetrical History: OB History     Gravida  1   Para  0   Term  0   Preterm  0   AB  0   Living  0      SAB  0   IAB  0   Ectopic  0   Multiple  0   Live Births  0           Social History Social History   Socioeconomic History   Marital status: Single    Spouse name: Not on file   Number of children: Not on file   Years of education: Not on file   Highest education level: Not on file  Occupational History   Not on file  Tobacco Use   Smoking status:  Every Day    Types: E-cigarettes   Smokeless tobacco: Never  Vaping Use   Vaping Use: Former   Quit date: 12/18/2021   Substances: Nicotine, Flavoring  Substance and Sexual Activity   Alcohol use: Never   Drug use: Not Currently    Types: Marijuana    Comment: last use 2019   Sexual activity: Not Currently    Birth control/protection: None  Other Topics Concern   Not on file  Social History Narrative   Not on file   Social Determinants of Health   Financial Resource Strain: Not on file  Food Insecurity: No Food Insecurity (02/03/2022)   Hunger Vital Sign    Worried About Running Out of Food in the Last Year: Never true    Ran Out of Food in the Last Year: Never true  Transportation Needs: No Transportation Needs (02/03/2022)   PRAPARE - Administrator, Civil Service (Medical): No    Lack of Transportation (Non-Medical): No  Physical Activity: Not on file  Stress: Not on file  Social Connections:  Not on file    Family History: Family History  Problem Relation Age of Onset   Hypertension Mother    Seizures Mother    Hypertension Father    Hypertension Sister    Asthma Sister    Cancer Paternal Aunt    Asthma Paternal Aunt    Cancer Paternal Grandmother    Breast cancer Paternal Grandmother    Diabetes Neg Hx    Heart disease Neg Hx    Kidney disease Neg Hx    Stroke Neg Hx     Allergies: Allergies  Allergen Reactions   Bee Venom    Latex     Medications Prior to Admission  Medication Sig Dispense Refill Last Dose   EPIPEN 2-PAK 0.3 MG/0.3ML SOAJ injection Inject into the muscle. (Patient not taking: Reported on 11/04/2021)      Ferrous Sulfate (IRON PO) Take by mouth.      Prenatal 27-1 MG TABS Take 1 tablet by mouth daily. 30 tablet 11    sertraline (ZOLOFT) 50 MG tablet Take by mouth. (Patient not taking: Reported on 01/29/2022)      VENTOLIN HFA 108 (90 Base) MCG/ACT inhaler SMARTSIG:2 Puff(s) By Mouth Every 4-6 Hours PRN        Review of Systems   All systems reviewed and negative except as stated in HPI  Last menstrual period 05/04/2021. General appearance: alert, cooperative, and appears stated age Lungs: clear to auscultation bilaterally Heart: regular rate and rhythm Abdomen: soft, non-tender; bowel sounds normal Extremities: Homans sign is negative, no sign of DVT Presentation: cephalic Fetal monitoringBaseline: 130 bpm, Variability: Good {> 6 bpm), Accelerations: Reactive, and Decelerations: Absent Uterine activityNone Dilation: 2 Effacement (%): 50 Station: -3 Exam by:: Dr. Robb Matar   Prenatal labs: ABO, Rh: A/Positive/-- (04/18 1134) Antibody: Negative (04/18 1134) Rubella: <0.90 (04/18 1134) RPR: Non Reactive (07/18 0845)  HBsAg: Negative (04/18 1134)  HIV: Non Reactive (07/18 0845)  GBS: Positive/-- (09/28 1440)  1 hr Glucola nml Genetic screening  nml Anatomy US nml  Prenatal Transfer Tool  Maternal Diabetes: No Genetic  Screening: Normal Maternal Ultrasounds/Referrals: Normal Fetal Ultrasounds or other Referrals:  None Maternal Substance Abuse:  No Significant Maternal Medications:  None Significant Maternal Lab Results:  Group B Strep positive Number of Prenatal Visits:greater than 3 verified prenatal visits Other Comments:  None  No results found for this or any previous visit (from the past 24  hour(s)).  Patient Active Problem List   Diagnosis Date Noted   History of poor fetal growth 02/03/2022   Group B Streptococcus carrier, +RV culture, currently pregnant 01/19/2022   Fetal growth restriction antepartum 12/01/2021   Marginal insertion of umbilical cord affecting management of mother 11/04/2021   Rubella non-immune status, antepartum 08/06/2021   Supervision of low-risk first pregnancy 07/08/2021   PTSD (post-traumatic stress disorder) 04/25/2021   ADHD 03/31/2021   Anemia 03/31/2021   Anxiety 03/31/2021   Depression 03/31/2021   Dyslexia 03/31/2021   Exercise-induced asthma 03/31/2021   Scoliosis 03/31/2021    Assessment/Plan:  Zeola Brys is a 21 y.o. G1P0000 at [redacted]w[redacted]d here for IOL elective (resolved FGR), Marginal cord insertion  #Labor: FB placed, Cyto PO #Pain: Support, epidural upon request #FWB: CAT 1 #ID:  GBS pos- PCN #MOF: Breast #MOC:POPs  Myrtie Hawk, DO  02/03/2022, 1:36 AM

## 2022-02-03 NOTE — Progress Notes (Signed)
   PRENATAL VISIT NOTE  Subjective:  Savannah Santos is a 21 y.o. G1P0000 at 38w4 dbeing seen today for ongoing prenatal care.  She is currently monitored for the following issues for this high-risk pregnancy and has Supervision of low-risk first pregnancy; ADHD; Anemia; Anxiety; Depression; Dyslexia; Exercise-induced asthma; PTSD (post-traumatic stress disorder); Scoliosis; Rubella non-immune status, antepartum; Marginal insertion of umbilical cord affecting management of mother; Fetal growth restriction antepartum; Group B Streptococcus carrier, +RV culture, currently pregnant; and History of poor fetal growth on their problem list.  Patient reports no complaints.  Contractions: Irritability. Vag. Bleeding: None.  Movement: Present. Denies leaking of fluid.   The following portions of the patient's history were reviewed and updated as appropriate: allergies, current medications, past family history, past medical history, past social history, past surgical history and problem list.   Objective:   Vitals:   01/29/22 1508  BP: 130/71  Pulse: (!) 120  Weight: 157 lb 9.6 oz (71.5 kg)    Fetal Status: Fetal Heart Rate (bpm): 150   Movement: Present     General:  Alert, oriented and cooperative. Patient is in no acute distress.  Skin: Skin is warm and dry. No rash noted.   Cardiovascular: Normal heart rate noted  Respiratory: Normal respiratory effort, no problems with respiration noted  Abdomen: Soft, gravid, appropriate for gestational age.  Pain/Pressure: Present     Pelvic: Cervical exam deferred        Extremities: Normal range of motion.     Mental Status: Normal mood and affect. Normal behavior. Normal judgment and thought content.   Assessment and Plan:  Pregnancy: G1P0000 at 38w4 1. Fetal growth restriction antepartum Plan for IOL after 39 weeks DIscussed IOL 10/17  Will schedule  2. Encounter for supervision of low-risk first pregnancy in third trimester Vigorous  movement [place] to [place] appointment No concerns today   Term labor symptoms and general obstetric precautions including but not limited to vaginal bleeding, contractions, leaking of fluid and fetal movement were reviewed in detail with the patient. Please refer to After Visit Summary for other counseling recommendations.   No follow-ups on file.  No future appointments.  Caren Macadam, MD

## 2022-02-04 LAB — CBC
HCT: 23.7 % — ABNORMAL LOW (ref 36.0–46.0)
Hemoglobin: 7.8 g/dL — ABNORMAL LOW (ref 12.0–15.0)
MCH: 31.6 pg (ref 26.0–34.0)
MCHC: 32.9 g/dL (ref 30.0–36.0)
MCV: 96 fL (ref 80.0–100.0)
Platelets: 180 10*3/uL (ref 150–400)
RBC: 2.47 MIL/uL — ABNORMAL LOW (ref 3.87–5.11)
RDW: 13 % (ref 11.5–15.5)
WBC: 17.3 10*3/uL — ABNORMAL HIGH (ref 4.0–10.5)
nRBC: 0 % (ref 0.0–0.2)

## 2022-02-04 MED ORDER — SODIUM CHLORIDE 0.9 % IV SOLN
500.0000 mg | Freq: Once | INTRAVENOUS | Status: AC
  Administered 2022-02-04: 500 mg via INTRAVENOUS
  Filled 2022-02-04: qty 500

## 2022-02-04 MED ORDER — ALBUTEROL SULFATE (2.5 MG/3ML) 0.083% IN NEBU: 2.5000 mg | INHALATION_SOLUTION | Freq: Once | RESPIRATORY_TRACT | Status: DC | PRN

## 2022-02-04 MED ORDER — METHYLPREDNISOLONE SODIUM SUCC 125 MG IJ SOLR: 125.0000 mg | Freq: Once | INTRAMUSCULAR | Status: DC | PRN

## 2022-02-04 MED ORDER — FERROUS SULFATE 325 (65 FE) MG PO TABS
325.0000 mg | ORAL_TABLET | ORAL | Status: DC
  Administered 2022-02-05: 325 mg via ORAL
  Filled 2022-02-04: qty 1

## 2022-02-04 MED ORDER — SODIUM CHLORIDE 0.9 % IV BOLUS: 500.0000 mL | Freq: Once | INTRAVENOUS | Status: DC | PRN

## 2022-02-04 MED ORDER — DIPHENHYDRAMINE HCL 50 MG/ML IJ SOLN: 25.0000 mg | Freq: Once | INTRAMUSCULAR | Status: DC | PRN

## 2022-02-04 MED ORDER — EPINEPHRINE PF 1 MG/ML IJ SOLN: 0.3000 mg | Freq: Once | INTRAMUSCULAR | Status: DC | PRN

## 2022-02-04 MED ORDER — SODIUM CHLORIDE 0.9 % IV SOLN: INTRAVENOUS | Status: DC | PRN
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
## 2022-02-04 NOTE — Anesthesia Postprocedure Evaluation (Signed)
Anesthesia Post Note  Patient: Savannah Santos  Procedure(s) Performed: AN AD [place]     Patient location during evaluation: Mother Baby Anesthesia Type: Epidural Level of consciousness: awake and alert Pain management: pain level controlled Vital Signs Assessment: post-procedure vital signs reviewed and stable Respiratory status: spontaneous breathing, nonlabored ventilation and respiratory function stable Cardiovascular status: stable Postop Assessment: no headache, no backache and epidural receding Anesthetic complications: no   No notable events documented.  Last Vitals:  Vitals:   02/04/22 0100 02/04/22 0458  BP: 111/67 105/69  Pulse: 79 87  Resp: 16 18  Temp: 36.8 C   SpO2: 99% 100%    Last Pain:  Vitals:   02/04/22 0100  TempSrc: Oral  PainSc: 0-No pain   Pain Goal:                   Calvin Chura

## 2022-02-04 NOTE — Lactation Note (Signed)
This note was copied from a baby's chart. Lactation Consultation Note  Patient Name: Girl Joann Jorge [id number] Date: 02/04/2022 Reason for consult: Initial assessment;[place]ap[place];Term Age:19 hours Mom stated she had just finished BF the baby. Mom stated the baby is BF great. Mom is waking the baby every 3 hrs, changing diaper and baby BF great. Mom denies painful latches. Newborn feeding habits, STS, I&O, supply and demand reviewed. Mom doesn't have any questions or concerns at this time.  Encouraged to call for assistance if needed.  Maternal Data Has patient been taught Hand Expression?: Yes Does the patient have breastfeeding experience prior to this delivery?: No  Feeding    LATCH Score       Type of Nipple: Everted at rest and after stimulation  Comfort (Breast/Nipple): Soft / non-tender  Hold (Positioning): No assistance needed to correctly position infant at breast.      Lactation Tools Discussed/Used    Interventions Interventions: Breast feeding basics reviewed;Hand express  Discharge    Consult Status Consult Status: Follow-up Date: 02/05/22 Follow-up type: In-patient    Theodoro Kalata 02/04/2022, 5:32 AM

## 2022-02-04 NOTE — Progress Notes (Signed)
Post Partum Day 1 Subjective: Eating, drinking, voiding, ambulating well.  +flatus.  Lochia and pain wnl.  Denies dizziness, lightheadedness, or sob. No complaints.   Objective: Blood pressure 105/69, pulse 87, temperature 98.3 F (36.8 C), temperature source Oral, resp. rate 18, height 5\' 7"  (1.702 m), weight 71.7 kg, last menstrual period 05/04/2021, SpO2 100 %, unknown if currently breastfeeding.  Physical Exam:  General: alert, cooperative, and no distress Lochia: appropriate Uterine Fundus: firm Incision: n/a DVT Evaluation: No evidence of DVT seen on physical exam. Negative Homan's sign. No cords or calf tenderness. No significant calf/ankle edema.  Recent Labs    02/03/22 0044 02/04/22 0545  HGB 10.1* 7.8*  HCT 30.7* 23.7*    Assessment/Plan: Plan for discharge tomorrow, Breastfeeding, and Contraception plans POPs Asymptomatic anemia, IV Venofer ordered, then po Fe QOD   LOS: 1 day   Roma Schanz, CNM 02/04/2022, 6:59 AM

## 2022-02-05 ENCOUNTER — Encounter: Payer: Medicaid Other | Admitting: Family Medicine

## 2022-02-05 MED ORDER — MEASLES, MUMPS & RUBELLA VAC IJ SOLR
0.5000 mL | Freq: Once | INTRAMUSCULAR | Status: AC
  Administered 2022-02-05: 0.5 mL via SUBCUTANEOUS
  Filled 2022-02-05: qty 0.5

## 2022-02-09 ENCOUNTER — Encounter: Payer: Medicaid Other | Admitting: Family Medicine

## 2022-02-09 ENCOUNTER — Other Ambulatory Visit: Payer: Medicaid Other

## 2022-02-17 ENCOUNTER — Telehealth (HOSPITAL_COMMUNITY): Payer: Self-pay | Admitting: *Deleted

## 2022-02-17 NOTE — Telephone Encounter (Signed)
Hospital Discharge Follow-Up Call:  Patient reports that she is well and has no concerns about her healing process.  EPDS today was 2 and she endorses this accurately reflects that she is doing well emotionally.  Patient says that baby is well and she has no concerns about baby's health.  She says that baby has an appointment with their Family Practice doctor at [facility] today for a routine 2 week check up. Patient reports that baby sleeps in a bedside bassinet.  Reviewed ABCs of Safe Sleep.

## 2022-02-19 ENCOUNTER — Encounter: Payer: Self-pay | Admitting: Family Medicine

## 2022-03-06 ENCOUNTER — Ambulatory Visit: Payer: Medicaid Other | Admitting: Family Medicine

## 2022-03-11 ENCOUNTER — Other Ambulatory Visit: Payer: Self-pay | Admitting: Family Medicine

## 2022-03-11 DIAGNOSIS — Z30011 Encounter for initial prescription of contraceptive pills: Secondary | ICD-10-CM

## 2022-03-11 MED ORDER — SLYND 4 MG PO TABS: 1.0000 | ORAL_TABLET | Freq: Every day | ORAL | 11 refills | Status: AC

## 2022-03-11 NOTE — Progress Notes (Signed)
POP prescription

## 2022-03-23 ENCOUNTER — Encounter: Payer: Self-pay | Admitting: Family Medicine

## 2022-03-23 ENCOUNTER — Other Ambulatory Visit: Payer: Self-pay

## 2022-03-23 ENCOUNTER — Ambulatory Visit (INDEPENDENT_AMBULATORY_CARE_PROVIDER_SITE_OTHER): Payer: Medicaid Other | Admitting: Family Medicine

## 2022-03-23 VITALS — BP 114/71 | HR 96 | Ht 67.0 in | Wt 134.4 lb

## 2022-03-23 DIAGNOSIS — F4310 Post-traumatic stress disorder, unspecified: Secondary | ICD-10-CM | POA: Diagnosis not present

## 2022-03-23 NOTE — Progress Notes (Signed)
Post Partum Visit Note  Savannah Santos is a 21 y.o. G38P1001 female who presents for a postpartum visit. She is 6 weeks postpartum following a normal spontaneous vaginal delivery.  I have fully reviewed the prenatal and intrapartum course. The delivery was at 39.2 gestational weeks.  Anesthesia: epidural. Postpartum course has been manual removal of placenta. Baby is doing well. Baby is feeding by breast. Bleeding no bleeding. Bowel function is normal. Bladder function is normal. Patient is not sexually active. Contraception method is oral progesterone-only contraceptive. Postpartum depression screening: negative.   The pregnancy intention screening data noted above was reviewed. Potential methods of contraception were discussed. The patient elected to proceed with No data recorded.   Edinburgh Postnatal Depression Scale - 03/23/22 1020       Edinburgh Postnatal Depression Scale:  In the Past 7 Days   I have been able to laugh and see the funny side of things. 0    I have looked forward with enjoyment to things. 0    I have blamed myself unnecessarily when things went wrong. 0    I have been anxious or worried for no good reason. 0    I have felt scared or panicky for no good reason. 0    Things have been getting on top of me. 0    I have been so unhappy that I have had difficulty sleeping. 0    I have felt sad or miserable. 0    I have been so unhappy that I have been crying. 0    The thought of harming myself has occurred to me. 0    Edinburgh Postnatal Depression Scale Total 0             Health Maintenance Due  Topic Date Due   COVID-19 Vaccine (1) Never done   HPV VACCINES (1 - 2-dose series) Never done    The following portions of the patient's history were reviewed and updated as appropriate: allergies, current medications, past family history, past medical history, past social history, past surgical history, and problem list.  Review of Systems Pertinent items are  noted in HPI.  Objective:  BP 114/71   Pulse 96   Ht 5\' 7"  (1.702 m)   Wt 134 lb 6.4 oz (61 kg)   LMP 03/16/2022 (Exact Date)   Breastfeeding Yes   BMI 21.05 kg/m    General:  alert, cooperative, and appears stated age   Breasts:  not indicated  Lungs: clear to auscultation bilaterally  Heart:  regular rate and rhythm, S1, S2 normal, no murmur, click, rub or gallop  Abdomen: soft, non-tender; bowel sounds normal; no masses,  no organomegaly   Wound NA  GU exam:  not indicated       Assessment:   Normal postpartum exam.   Plan:   Essential components of care per ACOG recommendations:  1.  Mood and well being: Patient with negative depression screening today. Reviewed local resources for support. She has involvement through [facility] and has pysch follow up and therapy follow up - Patient tobacco use? No.   - hx of drug use? No.    2. Infant care and feeding:  -Patient currently breastmilk feeding? Yes. Reviewed importance of draining breast regularly to support lactation.  -Social determinants of health (SDOH) reviewed in EPIC. No concern. Well connect with [facility].   3. Sexuality, contraception and birth spacing - Patient does not want a pregnancy in the next year.  Desired family size  is 2 children.  - Reviewed reproductive life planning. Reviewed contraceptive methods based on pt preferences and effectiveness.  Patient desired Oral Contraceptive today.   - Discussed birth spacing of 18 months  4. Sleep and fatigue -Encouraged family/partner/community support of 4 hrs of uninterrupted sleep to help with mood and fatigue  5. Physical Recovery  - Discussed patients delivery and complications. She describes her labor as good. - Patient had a Vaginal problems after delivery including manual placenta extraction . Patient had a 1st degree laceration. Perineal healing reviewed. Patient expressed understanding - Patient has urinary incontinence? No. - Patient is safe to  resume physical and sexual activity  6.  Health Maintenance - HM due items addressed Yes - Last pap smear No results found for: "DIAGPAP" Pap smear not done at today's visit. Due at age 34 -Breast Cancer screening indicated? No.   7. Chronic Disease/Pregnancy Condition follow up: None  - PCP follow up   [facility]

## 2023-02-11 ENCOUNTER — Other Ambulatory Visit: Payer: Self-pay | Admitting: Family Medicine

## 2023-02-11 DIAGNOSIS — Z30011 Encounter for initial prescription of contraceptive pills: Secondary | ICD-10-CM

## 2023-03-30 IMAGING — US US OB < 14 WEEKS - US OB TV
1 series · 15 of 28 positions shown · non-contrast
Comparison: None.

CLINICAL DATA: Vaginal bleeding in the first trimester.

EXAM:
OBSTETRIC <14 WK US AND TRANSVAGINAL OB
US COLOR DOPPLER ULTRASOUND OF OVARIES
TECHNIQUE: Both transabdominal and transvaginal ultrasound examinations were
performed for complete evaluation of the gestation as well as the
maternal uterus, adnexal regions, and pelvic cul-de-sac.
Transvaginal technique was performed to assess early pregnancy.
Color Doppler ultrasound was utilized to evaluate blood flow to the
ovaries.

[Series 1: us ob < 14 weeks - us ob tv · 15 of 50 slices shown]
[im 1/50]
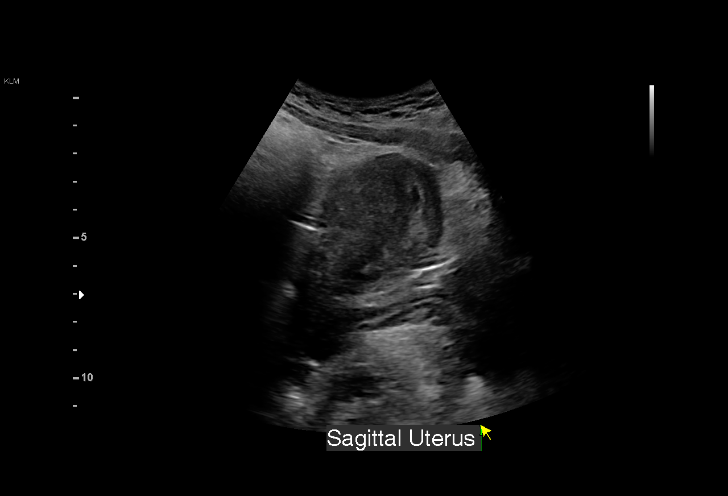
[im 4/50]
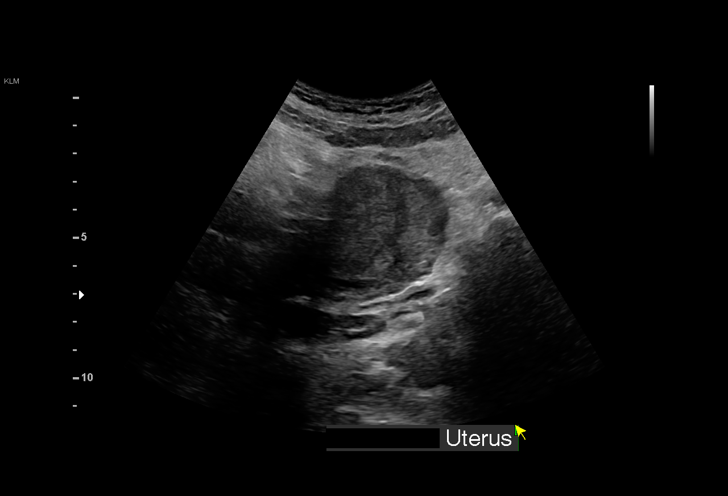
[im 8/50]
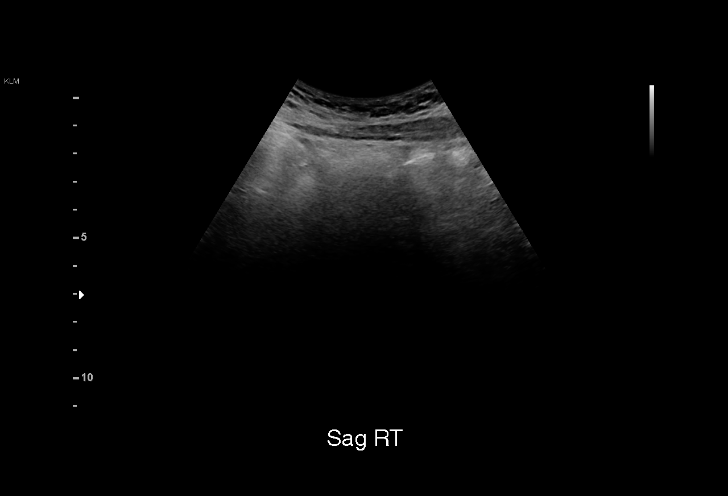
[im 11/50]
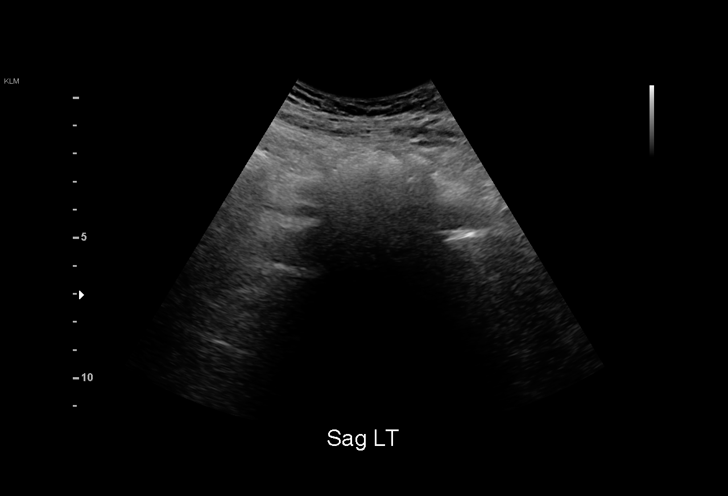
[im 15/50]
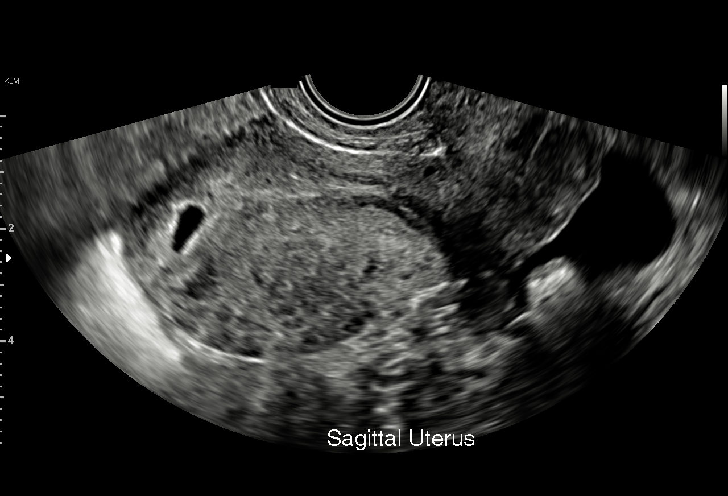
[im 19/50]
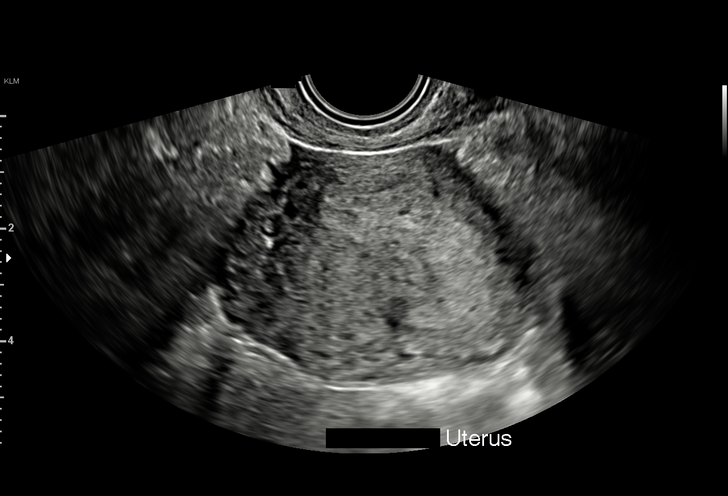
[im 22/50]
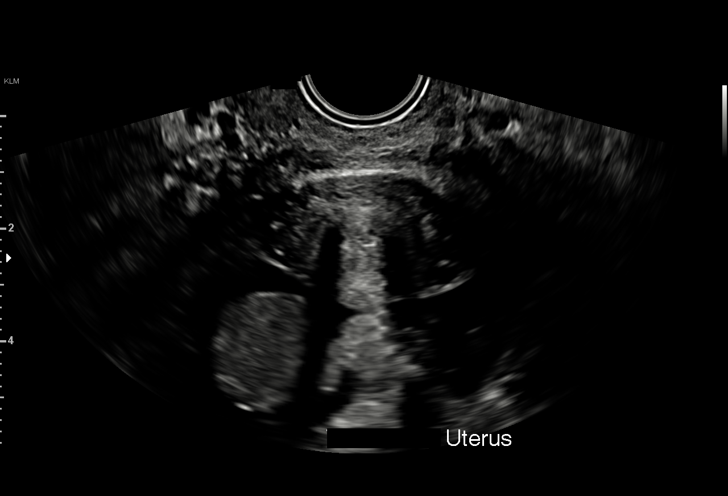
[im 26/50]
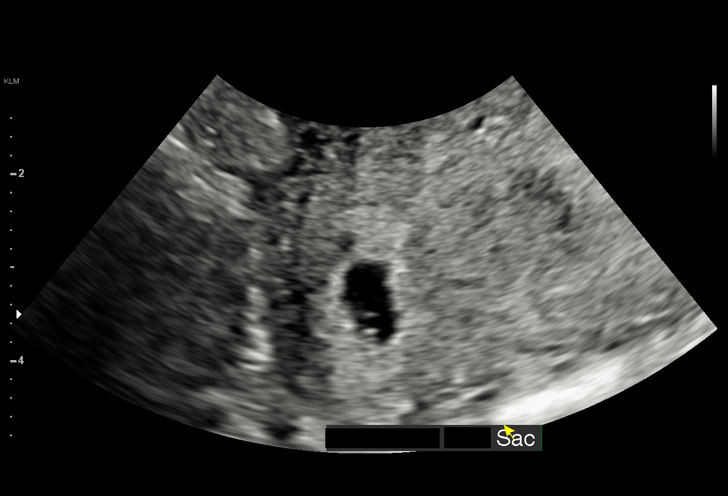
[im 28/50]
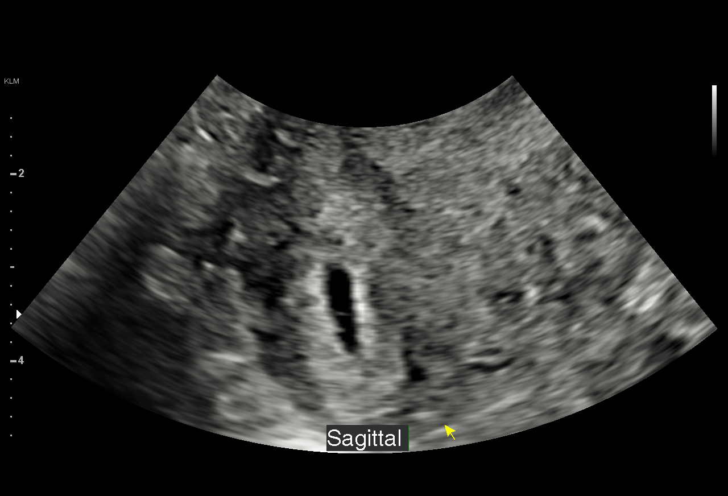
[im 31/50]
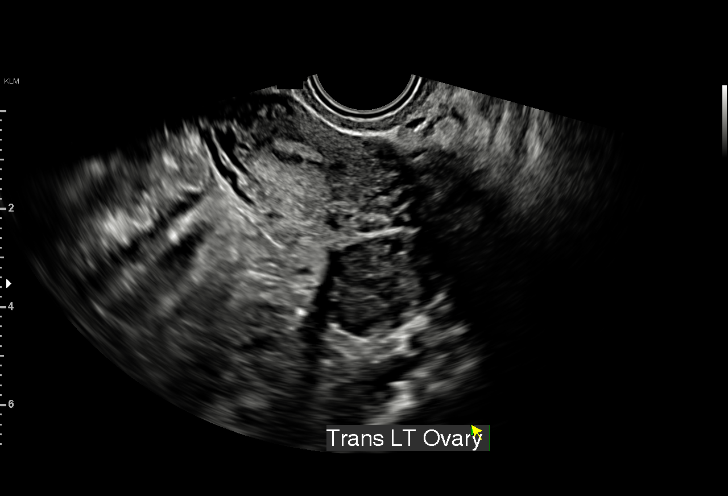
[im 35/50]
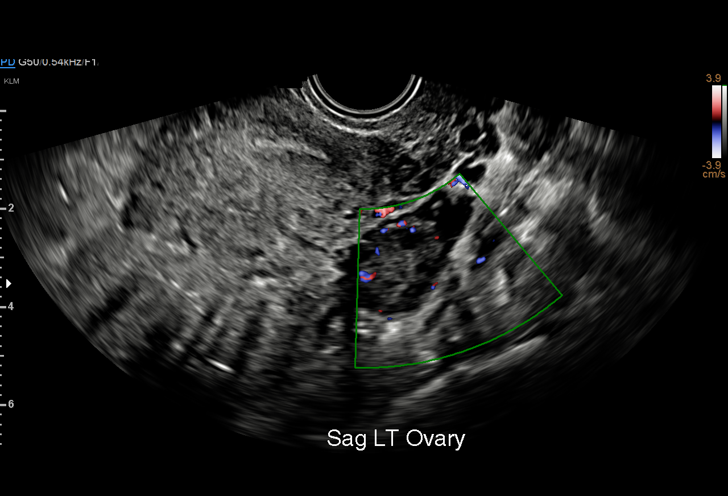
[im 39/50]
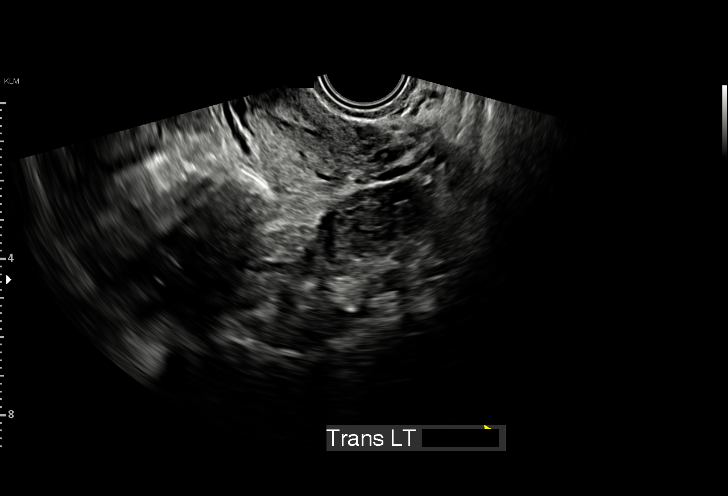
[im 42/50]
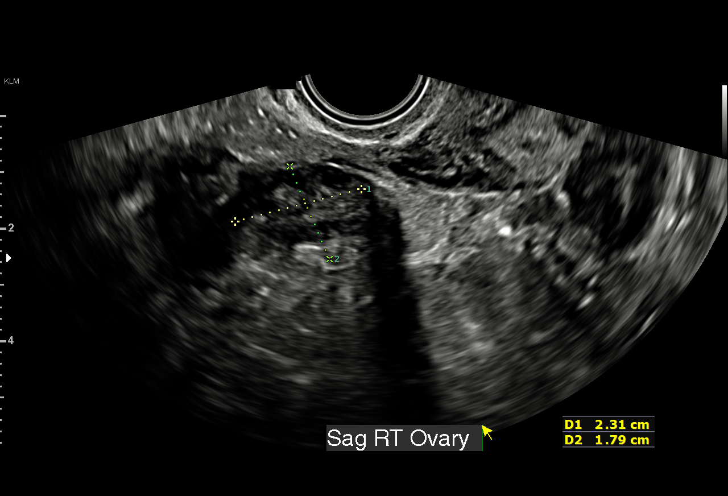
[im 46/50]
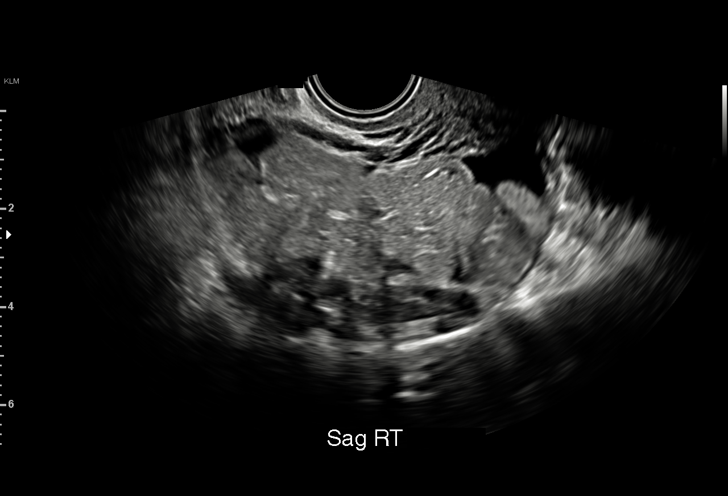
[im 50/50]
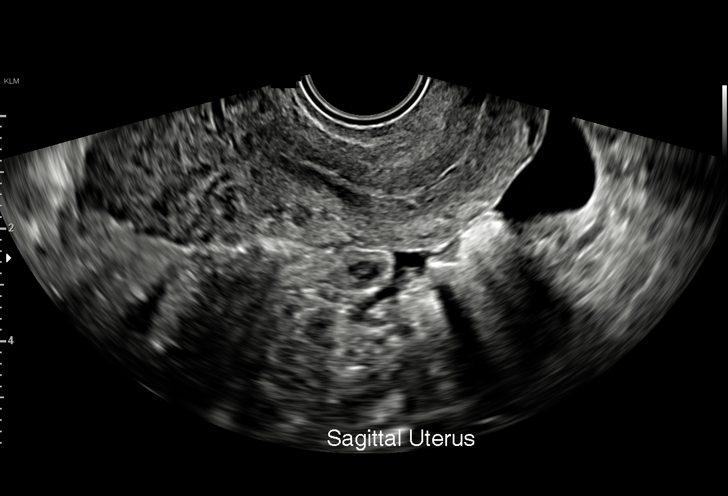

[15 of 28 positions shown; findings below may reference images not displayed]

FINDINGS: Intrauterine gestational sac: Single

Yolk sac:  Visualized.

Embryo:  Not Visualized.

Cardiac Activity: N/a

Heart Rate: N/a bpm

MSD: 6.0 mm   5 w   2 d +/-10 days

CRL: Embryo not detected; US EDC: By LMP would be 02/08/2022. EDC
calculation was not made based on the ultrasound gestational sac
age.

Subchorionic hemorrhage:  None visualized.

Maternal uterus/adnexae: Anteverted uterus with no visible wall
mass. The cervix is closed measuring 2.9 cm in length. The ovaries
are unremarkable and normal in size. There is a mild amount of
anechoic free fluid in the cul-de-sac space tracking into the
posterior adnexa. No adnexal mass is seen.

Color doppler evaluation of both ovaries demonstrates normal color
flow.
IMPRESSION: 1. Five weeks 2 days gestational sac without visible embryo with
yolk sac seen. Short interval follow-up study recommended to confirm
a living pregnancy.
2. No appreciable subchorionic hemorrhage , no cervical funneling or
shortening.
3. Small amount of anechoic cul-de-sac and posterior adnexal free
fluid.
4. Unremarkable ovaries.

## 2023-05-04 IMAGING — US US OB TRANSVAGINAL
1 series · 15 of 28 positions shown · non-contrast
Comparison: None.

CLINICAL DATA: Viability, spotting

EXAM:
OBSTETRIC <14 WK US AND TRANSVAGINAL OB US
TECHNIQUE: Both transabdominal and transvaginal ultrasound examinations were
performed for complete evaluation of the gestation as well as the
maternal uterus, adnexal regions, and pelvic cul-de-sac.
Transvaginal technique was performed to assess early pregnancy.

[Series 1: us ob transvaginal · 33 acquisitions, 15 frames shown]
[im 1/33]
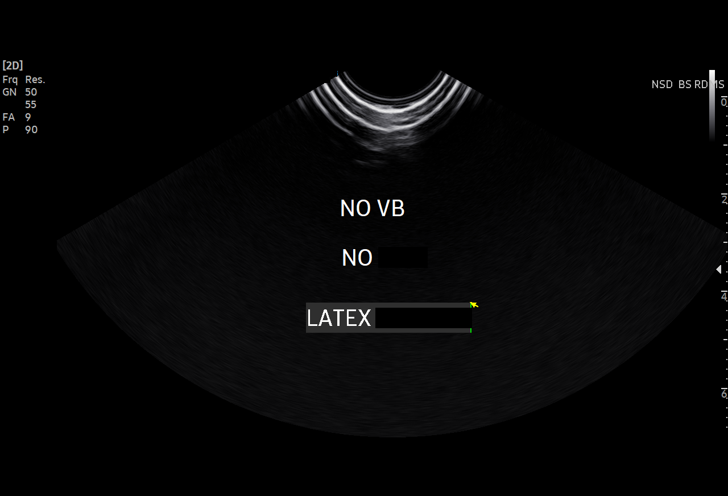
[im 3/33]
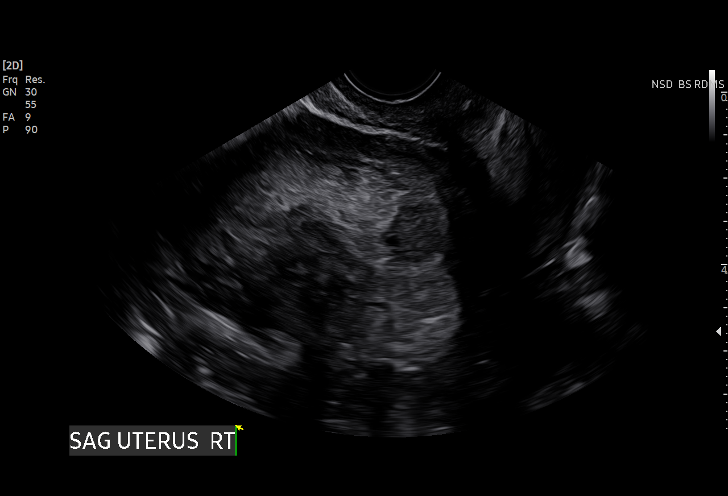
[im 5/33]
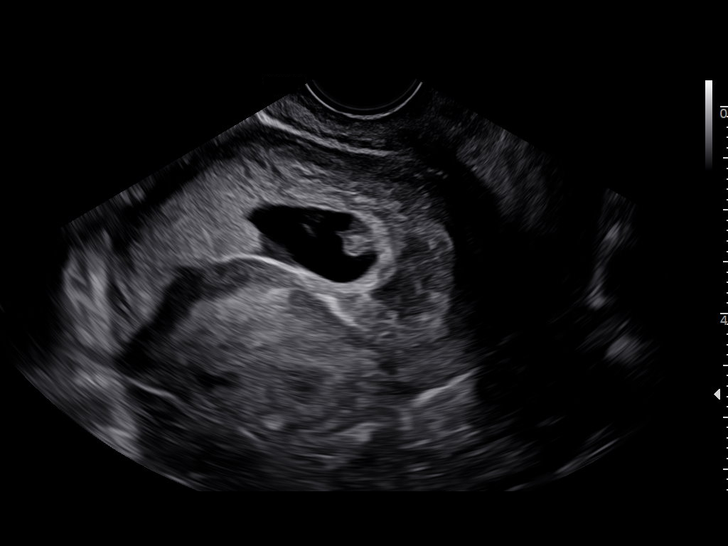
[im 8/33]
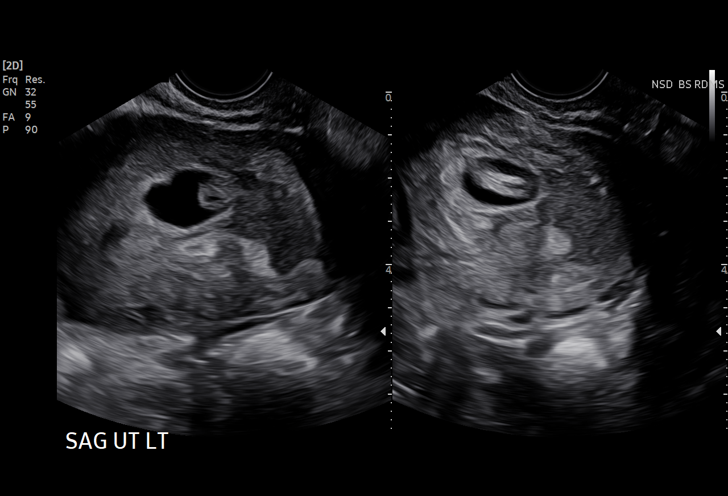
[im 10/33]
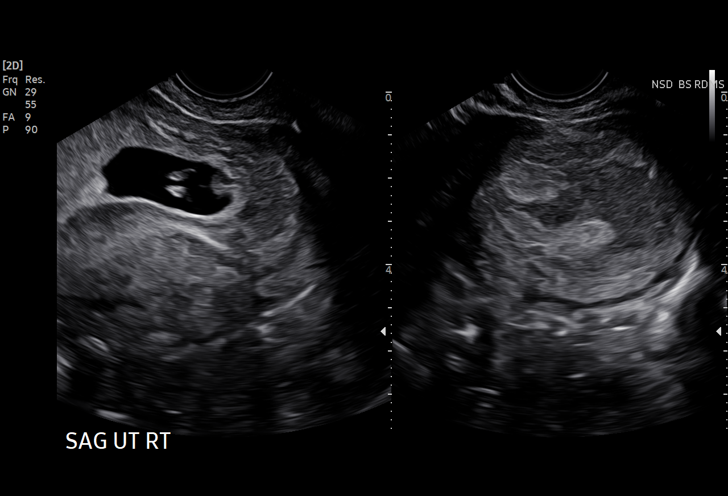
[im 12/33]
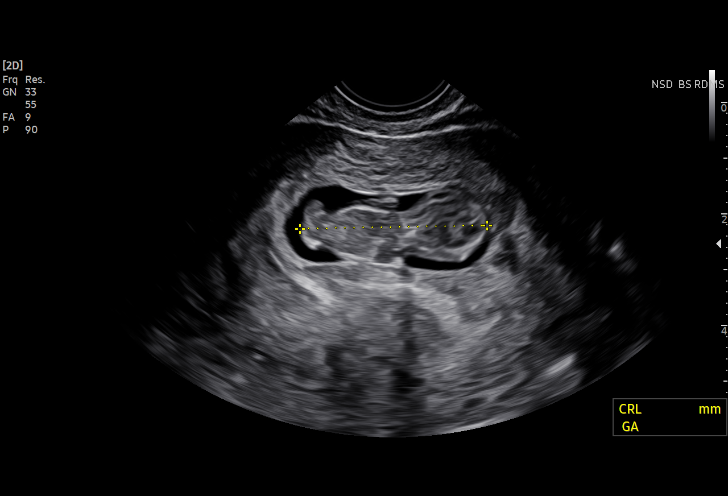
[im 15/33]
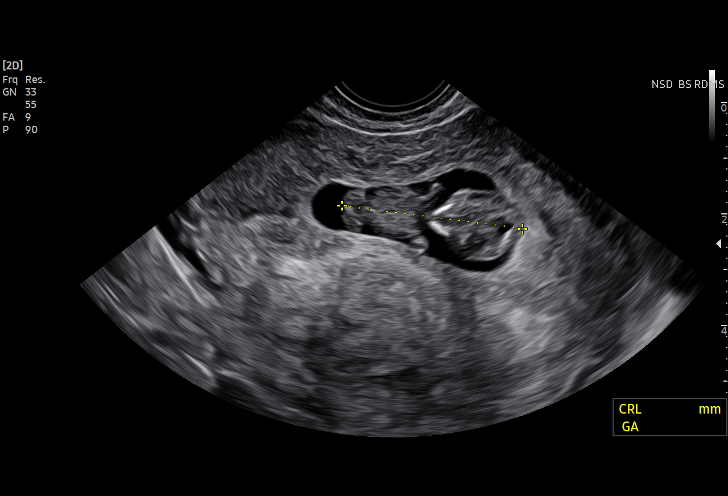
[im 17/33]
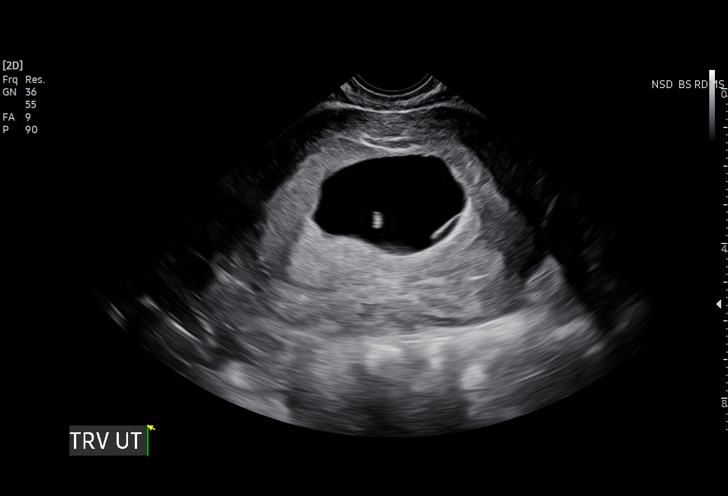
[im 18/33]
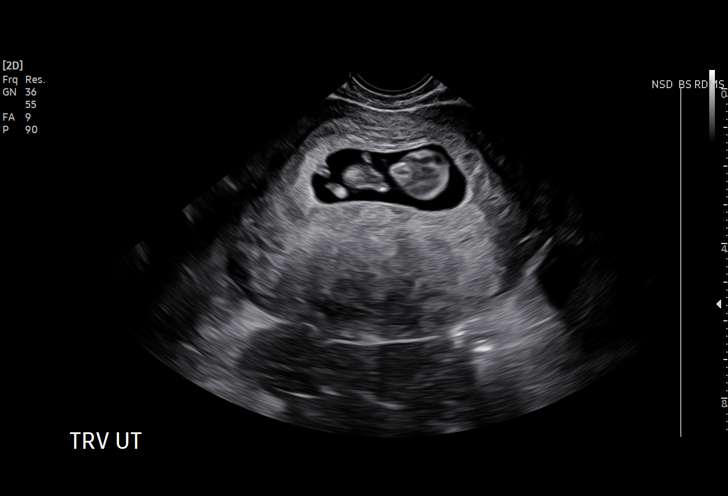
[im 21/33]
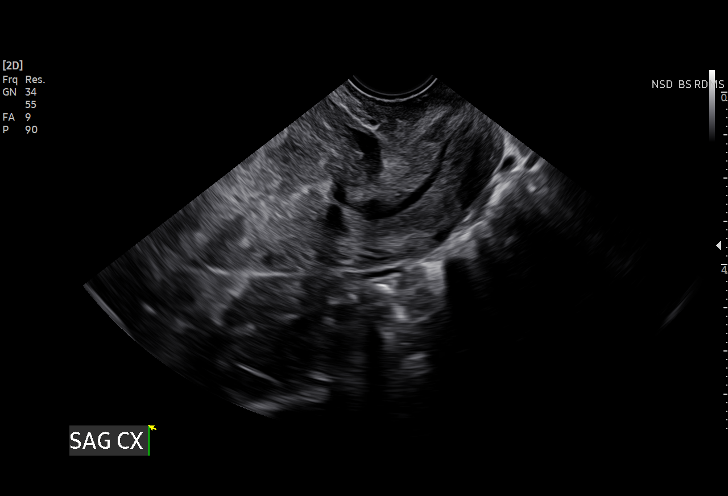
[im 23/33]
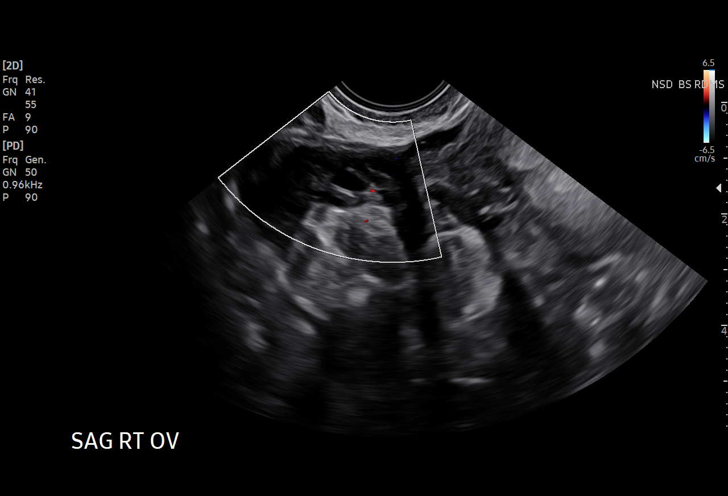
[im 25/33]
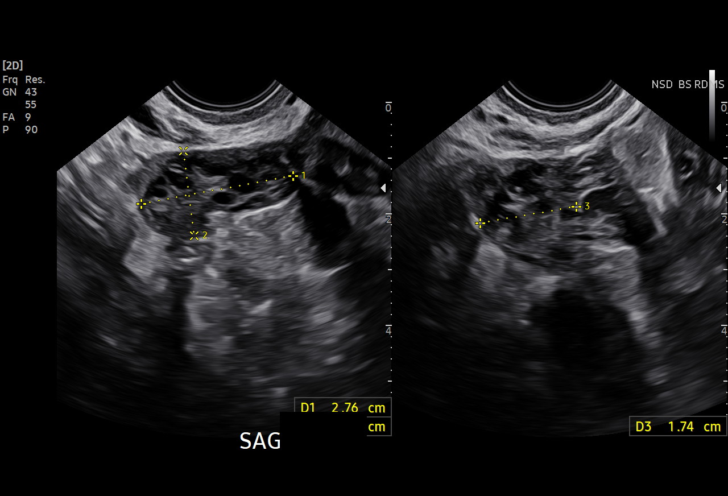
[im 28/33]
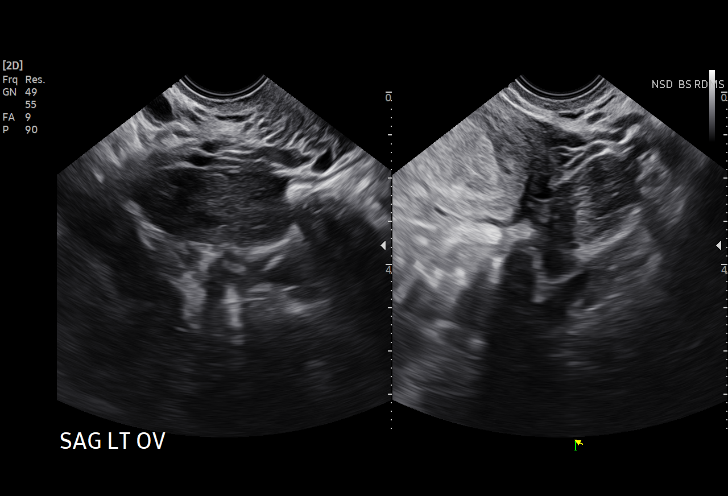
[im 30/33]
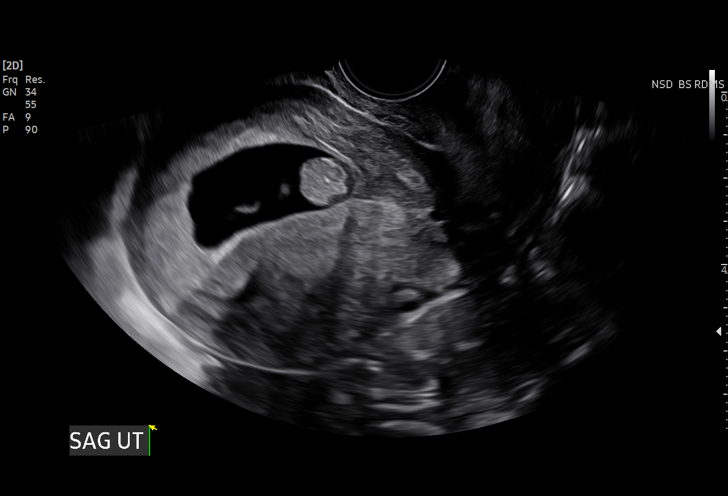
[im 33/33]
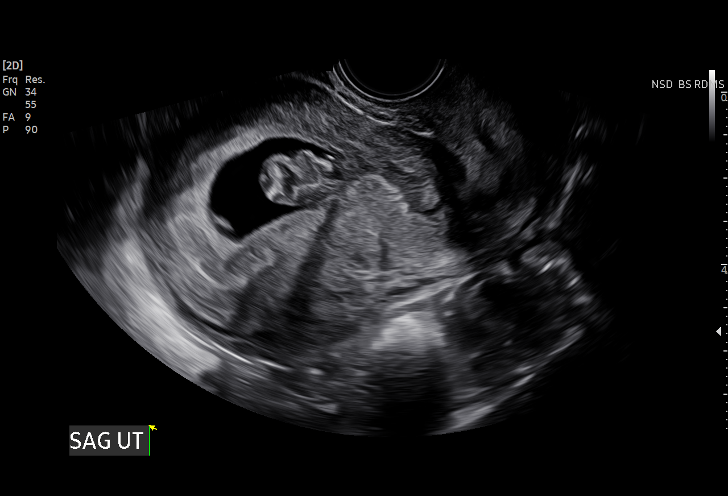

[15 of 28 positions shown; findings below may reference images not displayed]

FINDINGS: Intrauterine gestational sac: Single

Yolk sac:  Not Visualized.

Embryo:  Visualized.

Cardiac Activity: Visualized.

Heart Rate: 177  bpm

CRL: 32.1  mm   10 w   1 d                  US EDC: 02/10/2022

Subchorionic hemorrhage: Large subchorionic hemorrhage measuring 1 x
2 x 3.4 cm.

Maternal uterus/adnexae: Normal ovaries. No adnexal mass. No pelvic
free fluid.
IMPRESSION: 1. Single live intrauterine pregnancy as detailed above.
2. Large subchorionic hemorrhage. Attention on follow-up examination
is recommended.

.

1.
